# Patient Record
Sex: Female | Born: 1960 | Race: White | Hispanic: No | Marital: Married | Smoking: Never smoker
Health system: Southern US, Community
[De-identification: ages and names within clinical notes are randomized; demographics above are authoritative.]

## PROBLEM LIST (undated history)

## (undated) DIAGNOSIS — H04129 Dry eye syndrome of unspecified lacrimal gland: Secondary | ICD-10-CM

## (undated) HISTORY — DX: Dry eye syndrome of unspecified lacrimal gland: H04.129

---

## 1997-11-12 ENCOUNTER — Ambulatory Visit (HOSPITAL_COMMUNITY): Admission: RE | Admit: 1997-11-12 | Discharge: 1997-11-12 | Payer: Self-pay | Admitting: Obstetrics & Gynecology

## 1999-06-16 ENCOUNTER — Encounter: Payer: Self-pay | Admitting: Obstetrics and Gynecology

## 1999-06-16 ENCOUNTER — Ambulatory Visit (HOSPITAL_COMMUNITY): Admission: RE | Admit: 1999-06-16 | Discharge: 1999-06-16 | Payer: Self-pay | Admitting: Obstetrics and Gynecology

## 2002-04-02 ENCOUNTER — Ambulatory Visit (HOSPITAL_COMMUNITY): Admission: RE | Admit: 2002-04-02 | Discharge: 2002-04-02 | Payer: Self-pay | Admitting: Family Medicine

## 2003-04-22 ENCOUNTER — Encounter (INDEPENDENT_AMBULATORY_CARE_PROVIDER_SITE_OTHER): Payer: Self-pay | Admitting: Specialist

## 2003-04-22 ENCOUNTER — Ambulatory Visit (HOSPITAL_COMMUNITY): Admission: RE | Admit: 2003-04-22 | Discharge: 2003-04-22 | Payer: Self-pay | Admitting: Obstetrics and Gynecology

## 2006-09-06 ENCOUNTER — Ambulatory Visit (HOSPITAL_BASED_OUTPATIENT_CLINIC_OR_DEPARTMENT_OTHER): Admission: RE | Admit: 2006-09-06 | Discharge: 2006-09-06 | Payer: Self-pay | Admitting: Obstetrics and Gynecology

## 2006-09-06 ENCOUNTER — Encounter (INDEPENDENT_AMBULATORY_CARE_PROVIDER_SITE_OTHER): Payer: Self-pay | Admitting: Obstetrics and Gynecology

## 2007-03-20 ENCOUNTER — Encounter: Admission: RE | Admit: 2007-03-20 | Discharge: 2007-03-20 | Payer: Self-pay | Admitting: Obstetrics and Gynecology

## 2010-07-05 NOTE — Op Note (Signed)
NAME:  Stacey Mendez, Stacey Mendez                ACCOUNT NO.:  [id number]   MEDICAL RECORD NO.:  [id number]          PATIENT TYPE:  AMB   LOCATION:  [facility]                         FACILITY:  [facility]   PHYSICIAN:  Malachi Pro. Ambrose Mantle, M.D. DATE OF BIRTH:  1960/05/23   DATE OF PROCEDURE:  09/06/2006  DATE OF DISCHARGE:                               OPERATIVE REPORT   PREOPERATIVE DIAGNOSIS:  Abnormal uterine bleeding, fibroids.   POSTOPERATIVE DIAGNOSIS:  Abnormal uterine bleeding, fibroids, with  Nabothian cyst at 9 o'clock on the cervix.   OPERATION:  Dilatation and curettage, hysteroscopy, removal of Nabothian  cyst.   SURGEON:  Malachi Pro. Ambrose Mantle, M.D.   ANESTHESIA:  General anesthesia.   The patient was brought to the operating room and placed under  satisfactory general anesthesia and placed in lithotomy position.  Exam  revealed the uterus to be somewhat hard to feel, thought to be upper  limit of normal size.  The adnexa were free of masses.  The vulva,  vagina and perineum were prepped with Betadine solution and draped as a  sterile field with the collecting chamber underneath the buttocks to  collect the sorbitol.  The cervix was identified, drawn into the  operative field, and was dilated sufficiently to allow the hysteroscope  and sheath to be admitted into the endometrial cavity.  I took a survey  of the endometrial cavity.  I saw both tubal ostia and saw the fundus of  the uterus.  There was some tissue present in the endometrial cavity but  nothing suggestive of a polyp or fibroid. An endocervical curettage was  done followed by an endometrial curettage. The Nabothian cyst at 9  o'clock was removed.  I re-looked with the hysteroscope. I could tell  that most of the cavity had been curetted.  I used polyp forceps to try  to remove any additional tissue that had not been removed.  I then used  the hysteroscope biopsy forceps to remove one fragment of tissue.  At  the end of the procedure,  there was no bleeding.  Monsel's was then used  for the tenaculum sites and the cervical biopsy site and the procedure  was terminated.  The deficit on the sorbitol was 140 mL.  The patient  was returned to recovery in satisfactory condition.      Malachi Pro. Ambrose Mantle, M.D.  Electronically Signed     TFH/MEDQ  D:  09/06/2006  T:  09/06/2006  Job:  [id number]

## 2010-07-08 NOTE — Op Note (Signed)
NAME:  Stacey Mendez, Stacey Mendez                          ACCOUNT NO.:  [id number]   MEDICAL RECORD NO.:  [id number]                   PATIENT TYPE:  AMB   LOCATION:  DAY                                  FACILITY:  [facility]   PHYSICIAN:  Malachi Pro. Ambrose Mantle, M.D.              DATE OF BIRTH:  07-16-1960   DATE OF PROCEDURE:  04/22/2003  DATE OF DISCHARGE:                                 OPERATIVE REPORT   PREOPERATIVE DIAGNOSIS:  Abnormal uterine bleeding.   POSTOPERATIVE DIAGNOSIS:  Abnormal uterine bleeding with possible fibroid.   OPERATION:  Fraction dilatation and curettage, hysteroscopy.   SURGEON:  Malachi Pro. Ambrose Mantle, M.D.   ANESTHESIA:  General.   The patient was brought to the operating room and placed under satisfactory  general anesthesia and placed in the lithotomy position.  Exam revealed the  cervix to have a large nabothian cyst at 8 o'clock.  The uterus felt to be  enlarged and quite firm.  The adnexa were free of masses.  The uterine total  size was maybe 8-9 weeks.  The vulva, vagina, and perineum were prepped with  Betadine solution and draped as a sterile field.  A weighted speculum and  Deaver retractor were used for exposure.  The cervix was grasped with a  tenaculum and drawn into the operative field.  The uterus sounded to 4  inches slightly anteriorly.  The cervix was then dilated to a #29 Pratt  dilator.  An endocervical curettage was done, preserving all of the tissue  that I could find.  I then inserted the hysteroscope.  There was some  bleeding present, so I could not see extremely well, but I could see both  tubal ostia extremely well.  I could see the fundus of the uterus.  There  were no significant intraluminal masses.  I then removed the hysteroscope,  used polyp forceps, and then did a D&C until the tissue felt gritty.  I then  relooked with the hysteroscope, did one biopsy with the hysteroscopic  forceps, and felt that the cavity was pretty much clean.  Visibility  was  always somewhat diminished because of the vascularity of the endometrium.  The procedure was terminated.  There was a deficit of 70 cc.  The patient  was returned to recovery in satisfactory condition.                                               Malachi Pro. Ambrose Mantle, M.D.    TFH/MEDQ  D:  04/22/2003  T:  04/22/2003  Job:  [id number]

## 2010-12-05 LAB — CBC
HCT: 38.4
Hemoglobin: 13.2
MCHC: 34.3
MCV: 83.4
Platelets: 266
RBC: 4.6
RDW: 13.2
WBC: 4.9

## 2010-12-05 LAB — COMPREHENSIVE METABOLIC PANEL
ALT: 17
AST: 15
Albumin: 3.8
Alkaline Phosphatase: 58
BUN: 9
CO2: 25
Calcium: 9.4
Chloride: 109
Creatinine, Ser: 0.72
GFR calc Af Amer: >60
GFR calc non Af Amer: >60
Glucose, Bld: 136 — ABNORMAL HIGH
Potassium: 4.5
Sodium: 142
Total Bilirubin: 0.5
Total Protein: 6.4

## 2010-12-05 LAB — DIFFERENTIAL
Eosinophils Absolute: 0
Eosinophils Relative: 0
Lymphocytes Relative: 47 — ABNORMAL HIGH
Lymphs Abs: 2.3
Monocytes Absolute: 0.3
Monocytes Relative: 5

## 2010-12-05 LAB — PREGNANCY, URINE: Preg Test, Ur: NEGATIVE

## 2012-04-15 ENCOUNTER — Other Ambulatory Visit: Payer: Self-pay | Admitting: Family Medicine

## 2012-04-15 DIAGNOSIS — R109 Unspecified abdominal pain: Secondary | ICD-10-CM

## 2012-04-22 ENCOUNTER — Ambulatory Visit
Admission: RE | Admit: 2012-04-22 | Discharge: 2012-04-22 | Disposition: A | Discharge status: Home / Self Care | Payer: BC Managed Care – PPO | Source: Ambulatory Visit | Attending: Family Medicine | Admitting: Family Medicine

## 2012-04-22 DIAGNOSIS — R109 Unspecified abdominal pain: Secondary | ICD-10-CM

## 2012-06-03 ENCOUNTER — Other Ambulatory Visit: Payer: Self-pay | Admitting: Obstetrics and Gynecology

## 2013-01-10 ENCOUNTER — Other Ambulatory Visit: Payer: Self-pay | Admitting: Dermatology

## 2013-11-26 ENCOUNTER — Other Ambulatory Visit: Payer: Self-pay | Admitting: Obstetrics and Gynecology

## 2013-11-27 LAB — CYTOLOGY - PAP

## 2015-07-29 ENCOUNTER — Ambulatory Visit
Admission: RE | Admit: 2015-07-29 | Discharge: 2015-07-29 | Disposition: A | Discharge status: Home / Self Care | Payer: BLUE CROSS/BLUE SHIELD | Source: Ambulatory Visit | Attending: Family Medicine | Admitting: Family Medicine

## 2015-07-29 ENCOUNTER — Other Ambulatory Visit: Payer: Self-pay | Admitting: Family Medicine

## 2015-07-29 DIAGNOSIS — R0781 Pleurodynia: Secondary | ICD-10-CM

## 2015-12-13 ENCOUNTER — Ambulatory Visit (INDEPENDENT_AMBULATORY_CARE_PROVIDER_SITE_OTHER): Payer: BLUE CROSS/BLUE SHIELD | Admitting: Neurology

## 2015-12-13 ENCOUNTER — Encounter: Payer: Self-pay | Admitting: Neurology

## 2015-12-13 VITALS — BP 160/98 | HR 78 | Resp 16 | Ht 62.0 in | Wt 176.0 lb

## 2015-12-13 DIAGNOSIS — R51 Headache: Secondary | ICD-10-CM | POA: Diagnosis not present

## 2015-12-13 DIAGNOSIS — R251 Tremor, unspecified: Secondary | ICD-10-CM

## 2015-12-13 DIAGNOSIS — F40240 Claustrophobia: Secondary | ICD-10-CM | POA: Diagnosis not present

## 2015-12-13 DIAGNOSIS — R519 Headache, unspecified: Secondary | ICD-10-CM

## 2015-12-13 MED ORDER — PROPRANOLOL HCL 20 MG PO TABS: ORAL_TABLET | ORAL | 5 refills | Status: DC

## 2015-12-13 MED ORDER — ALPRAZOLAM 0.5 MG PO TABS: ORAL_TABLET | ORAL | 0 refills | Status: DC

## 2015-12-13 NOTE — Patient Instructions (Addendum)
Your tremor is mild, we will continue to monitor and try a small dose of symptomatic medication.  You have no signs for parkinsonism.   We will do a brain scan, called MRI and call you with the test results. We will have to schedule you for this on a separate date. This test requires authorization from your insurance, and we will take care of the insurance process.  We will try low dose Inderal (generic name: propranolol) 20 mg strength: take 1 pill each bedtime for 1 week, then 1 pill twice daily for 2 weeks, then 1 pill 3 times a day thereafter. Common side effect reported are: lethargy, sedation, low blood pressure and low pulse rate. Please monitor your BP and Pulse every few days, if your pulse drops lower than 55 you may feel bad and we may have to adjust your dose. Same with your BP below 110/55.   You can also use this eventually as needed. It may help your tremor, your migrainous headaches and tone down your anxiety.

## 2015-12-13 NOTE — Progress Notes (Signed)
Subjective:    Patient ID: Stacey Mendez is a 55 y.o. female.  HPI     Star Age, MD, PhD [facility] [address], [address] [place], [place] [address]  Dear Dr. Rex Kras,   I saw your patient, Stacey Mendez, upon your kind request in my neurologic clinic today for initial consultation of her tremors. The patient is accompanied by her husband today. As you know, Stacey Mendez is a 55 year old left-handed woman with an underlying medical history of hearing loss, chronic dry eyes, and obesity, and elevated blood pressure values, who reports bilateral upper extremity tremors for years. She has a family history of tremors in her brother and one sister. She was recently treated for sinusitis. She was recently also evaluated by her ophthalmologist for problems with her eyes and was diagnosed with dry eyes. I reviewed your office note from 11/19/2015, which you kindly included. Recent lab work includes CBC with differential, TSH, CMP, ESR, lipid panel. We will request test results from your office. She was treated with a Z-Pak in late September 2017 for sinusitis.  She also has a history of recurrent migrainous headaches for the past 2 months. She has been seen by ENT for suspected sinus issues. She had a CT which reportedly was normal. 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 I reviewed the office note from 11/30/2015, from Dr. Caprice Kluver as well. She has a Hx of migraines, infrequent, 1-2 per year.  HAs or eye pain is around the eyes, L more than R. Has had more anxiety. No recent stressors. She lives with her husband, this is her second marriage. She has 3 grown children from her first marriage. She does not drink caffeine in excess, usually one large sweet tea from [employer] per day. She is a nonsmoker and does not drink alcohol on a regular basis. She has 1 brother and 1 sister with tremors, one aunt with Parkinson's disease. She is left-handed and her handwriting  is not particularly affected by her fine motor skills are affected by the tremor. Nervousness and anxiety makes the tremor worse. She has had hot flashes and is perimenopausal. She has no overt triggers for her headaches or eye pain. She does endorse being anxious. Her husband vehemently endorses that she has been anxious. She has had recent borderline blood pressure values and was asked to keep a log. This also caused her anxiety to get worse she admits.  Her Past Medical History Is Significant For: Past Medical History:  Diagnosis Date  . Dry eye     Her Past Surgical History Is Significant For: Past Surgical History:  Procedure Laterality Date  . CESAREAN SECTION     x3    Her Family History Is Significant For: Family History  Problem Relation Age of Onset  . Fibromyalgia Mother   . Thyroid cancer Mother   . Hypertension Father   . Fibromyalgia Sister   . Breast cancer Maternal Grandmother     Her Social History Is Significant For: Social History   Social History  . Marital status: Married    Spouse name: N/A  . Number of children: 3  . Years of education: Assoc   Occupational History  . Retired    Social History Main Topics  . Smoking status: Never Smoker  . Smokeless tobacco: Never Used  . Alcohol use No  . Drug use: No  . Sexual activity: Not Asked   Other Topics Concern  . None  Social History Narrative   Drinks 1 large tea a day     Her Allergies Are:  Allergies  Allergen Reactions  . Cefaclor Diarrhea and Other (See Comments)  . Codeine Other (See Comments)    Numb all over  :   Her Current Medications Are:  Outpatient Encounter Prescriptions as of 12/13/2015  Medication Sig  . Polyethyl Glycol-Propyl Glycol (SYSTANE OP) Apply to eye.  . prednisoLONE acetate (PRED FORTE) 1 % ophthalmic suspension   . RESTASIS MULTIDOSE 0.05 % ophthalmic emulsion    No facility-administered encounter medications on file as of 12/13/2015.   : Review of  Systems:  Out of a complete 14 point review of systems, all are reviewed and negative with the exception of these symptoms as listed below:  Review of Systems  Neurological:       Patient reports having tremors in R hand for years. Recently it has become worse and started on the L side. She states that she is currently having a tremor in her R knee.   Patient has had pain around L eye for about 2 months. Has been seen by eye doctor. Dr. Dimas Millin ordered CT scan, reported no trouble with sinuses.    Objective:  Neurologic Exam  Physical Exam Physical Examination:   Vitals:   12/13/15 1429  BP: (!) 160/98  Pulse: 78  Resp: 16   General Examination: The patient is a very pleasant 55 y.o. female in no acute distress. She appears well-developed and well-nourished and well groomed. She is mildly anxious appearing.  HEENT: Normocephalic, atraumatic, pupils are equal, round and reactive to light and accommodation. Visual fields are full by finger perimetry.  Funduscopic exam is normal with sharp disc margins noted. Extraocular tracking is good without limitation to gaze excursion or nystagmus noted. Normal smooth pursuit is noted. Hearing is grossly intact. Tympanic membranes are clear bilaterally. Face is symmetric with normal facial animation and normal facial sensation. Speech is clear with no dysarthria noted. There is no hypophonia. There is no lip, neck/head, jaw or voice tremor. Neck is supple with full range of passive and active motion. There are no carotid bruits on auscultation. Oropharynx exam reveals: mild mouth dryness, good dental hygiene and mild airway crowding, due to smaller airway and larger appearing uvula. Mallampati is class II. Tongue protrudes centrally and palate elevates symmetrically. Tonsils are small in size.   Chest: Clear to auscultation without wheezing, rhonchi or crackles noted.  Heart: S1+S2+0, regular and normal without murmurs, rubs or gallops noted.    Abdomen: Soft, non-tender and non-distended with normal bowel sounds appreciated on auscultation.  Extremities: There is no pitting edema in the distal lower extremities bilaterally. Pedal pulses are intact. Legs are puffy.   Skin: Warm and dry without trophic changes noted.   Musculoskeletal: exam reveals no obvious joint deformities, tenderness or joint swelling or erythema.   Neurologically:  Mental status: The patient is awake, alert and oriented in all 4 spheres. Her immediate and remote memory, attention, language skills and fund of knowledge are appropriate. There is no evidence of aphasia, agnosia, apraxia or anomia. Speech is clear with normal prosody and enunciation. Thought process is linear. Mood is normal and affect is normal.  Cranial nerves II - XII are as described above under HEENT exam. In addition: shoulder shrug is normal with equal shoulder height noted. Motor exam: Normal bulk, strength and tone is noted. There is no drift, rest tremor or rebound.  On 12/13/2015: Her handwriting is  legible and not very tremulous, not micrographic. On Archimedes spiral drawing she has slight tremor on the left and slight to mild tremor noted on the right. Romberg is negative. Reflexes are 2-3+ throughout. Babinski: Toes are flexor bilaterally. Fine motor skills and coordination: intact with normal finger taps, normal hand movements, normal rapid alternating patting, normal foot taps and normal foot agility.  Cerebellar testing: No dysmetria or intention tremor on finger to nose testing. Heel to shin is unremarkable bilaterally. There is no truncal or gait ataxia.  Sensory exam: intact to light touch, pinprick, vibration, temperature sense in the upper and lower extremities.  Gait, station and balance: She stands easily. No veering to one side is noted. No leaning to one side is noted. Posture is age-appropriate and stance is narrow based. Gait shows normal stride length and normal pace. No  problems turning are noted. Tandem walk is unremarkable.               Assessment and Plan:   In summary, Stacey Mendez is a very pleasant 55 y.o.-year old female with an underlying medical history of hearing loss, chronic dry eyes, and obesity, and elevated blood pressure values, whose history and physical exam are in keeping with essential tremor. She has a history of tremors for years including a family history in 2 siblings. While she has a family history of Parkinson's disease and one aunt, I reassured her that her exam does not show any telltale signs of parkinsonism. For her recurrent headaches, she is advised that these could be migrainous headaches, in particular what with her own history of migraines and recent benign exam with ophthalmology and ENT. Exam is otherwise nonfocal and she is reassured. Today, I suggested for headache prevention as well as tremor symptomatic treatment we could try her on low-dose propranolol, starting at 20 mg strength once daily with gradual titration to up to 20 mg 3 times a day. I provided written instructions and a new prescription. In addition, this may help tone down her anxiety as well. She is agreeable to trying this, I also went over potential side effects with her as well. Diagnostically, I would like to proceed with a brain MRI with and without contrast and we will call her with her test results.  I suggested a follow-up in 4 months, sooner as needed.  She requested an open MRI and I also prescribed Xanax for her MRI due to claustrophobia reported.  reports bilateral upper extremity tremors for years. with an underlying medical I answered all their questions today and the patient and her husband were in agreement with the above outlined plan.  Thank you very much for allowing me to participate in the care of this nice patient. If I can be of any further assistance to you please do not hesitate to call me at [phone].  Sincerely,   Star Age, MD,  PhD

## 2015-12-14 ENCOUNTER — Telehealth: Payer: Self-pay | Admitting: Neurology

## 2015-12-14 NOTE — Telephone Encounter (Signed)
Patient is calling to discuss the dosage for medication propranolol (INDERAL) 20 MG tablet. Please call and advise.

## 2015-12-14 NOTE — Telephone Encounter (Signed)
I spoke to patient and advised her the verbal orders that Dr. Frances FurbishAthar just gave me. Dr. Frances FurbishAthar wants her to take as prescribed for now to see if patient feels a difference. Patient voiced understanding and will call us back if needed.

## 2016-01-03 ENCOUNTER — Telehealth: Payer: Self-pay | Admitting: Neurology

## 2016-01-03 NOTE — Telephone Encounter (Signed)
Pt called said she pulled a muscle this weekend. She wants to take advil or ibuprofen (she has RX for 800mg  but she knows that is too much). She is taking propranolol (INDERAL) 20 MG tablet and she read that NSAIDS are to be taken with caution. Please call and let her know how much or what to take with the propranolol. Thank you

## 2016-01-03 NOTE — Telephone Encounter (Signed)
Pt called to schedule MRI.  °

## 2016-01-03 NOTE — Telephone Encounter (Signed)
I have spoken with Stacey Mendez this afternoon and per RAS, advised it is ok to take Ibuprofen 800mg  as rx'd for short term--generally no longer than a week.  She verbalized understanding of same/fim

## 2016-01-03 NOTE — Telephone Encounter (Signed)
I called the patient back and spoke with her since she wanted a open MRI I sent the order to [facility]. She is aware of this and I also gave her their phone number which is [phone] [employer] NumbersBCBS auth: [id number] (exp. 01/03/16 to 02/01/16)

## 2016-01-05 DIAGNOSIS — R51 Headache: Secondary | ICD-10-CM | POA: Diagnosis not present

## 2016-01-06 ENCOUNTER — Ambulatory Visit (INDEPENDENT_AMBULATORY_CARE_PROVIDER_SITE_OTHER): Payer: Self-pay

## 2016-01-06 DIAGNOSIS — Z0289 Encounter for other administrative examinations: Secondary | ICD-10-CM

## 2016-01-06 DIAGNOSIS — R251 Tremor, unspecified: Secondary | ICD-10-CM

## 2016-01-06 DIAGNOSIS — R519 Headache, unspecified: Secondary | ICD-10-CM

## 2016-01-06 DIAGNOSIS — R51 Headache: Principal | ICD-10-CM

## 2016-01-18 NOTE — Progress Notes (Signed)
Please call patient regarding the recent brain MRI: The brain scan showed a normal structure of the brain and no volume loss which we call atrophy. There were changes in the deeper structures of the brain, which we call white matter changes or microvascular changes. These were reported as mild in Her case. These are tiny white spots, that occur with time and are seen in a variety of conditions, including with normal aging, chronic hypertension, chronic headaches, especially migraine HAs, chronic diabetes, chronic hyperlipidemia. These are not strokes and no mass or lesion or contrast enhancement was seen which is reassuring. There are possible findings suggesting possible increase fluid pressure around the eye nerves/optic nerves. Please have her see her eye doctor for a full eye exam including eye background check and full visual field check and to see if there is any evidence of papilledema, meaning swelling of the eye nerves.  Again, there were no acute findings, such as a stroke, or mass or blood products. No other action is required on this test at this time, other than re-enforcing the importance of good blood pressure control, good cholesterol control, good blood sugar control, and weight management. Please remind patient to keep any upcoming appointments or tests and to call us with any interim questions, concerns, problems or updates. Thanks,  Huston FoleySaima Anisa Leanos, MD, PhD

## 2016-01-20 ENCOUNTER — Telehealth: Payer: Self-pay

## 2016-01-20 MED ORDER — PROPRANOLOL HCL ER 60 MG PO CP24: 60.0000 mg | ORAL_CAPSULE | Freq: Every day | ORAL | 5 refills | Status: DC

## 2016-01-20 NOTE — Telephone Encounter (Signed)
-----   Message from Huston FoleySaima Athar, MD sent at 01/18/2016  4:03 PM EST ----- Please call patient regarding the recent brain MRI: The brain scan showed a normal structure of the brain and no volume loss which we call atrophy. There were changes in the deeper structures of the brain, which we call white matter changes or microvascular changes. These were reported as mild in Her case. These are tiny white spots, that occur with time and are seen in a variety of conditions, including with normal aging, chronic hypertension, chronic headaches, especially migraine HAs, chronic diabetes, chronic hyperlipidemia. These are not strokes and no mass or lesion or contrast enhancement was seen which is reassuring. There are possible findings suggesting possible increase fluid pressure around the eye nerves/optic nerves. Please have her see her eye doctor for a full eye exam including eye background check and full visual field check and to see if there is any evidence of papilledema, meaning swelling of the eye nerves.  Again, there were no acute findings, such as a stroke, or mass or blood products. No other action is required on this test at this time, other than re-enforcing the importance of good blood pressure control, good cholesterol control, good blood sugar control, and weight management. Please remind patient to keep any upcoming appointments or tests and to call us with any interim questions, concerns, problems or updates. Thanks,  Huston FoleySaima Athar, MD, PhD

## 2016-01-20 NOTE — Telephone Encounter (Signed)
I would like to suggest changing her propranolol 20 mg tid to long acting 60 mg once daily. She can take it in the morning after breakfast. Rx changed and sent to her pharm. Please call and notify patient, thx.

## 2016-01-20 NOTE — Telephone Encounter (Signed)
I spoke to patient and she is aware of medication change. She will start tomorrow. Confirmed pharmacy.

## 2016-01-20 NOTE — Telephone Encounter (Signed)
I spoke to patient and she is aware of results and recommendations. She will make an appointment with an eye doctor.  Patient wants to ask if you can change her medication. Aurea GraffJoan states that her tremors are better, her anxiety is reduced, and she feels better on the Propranolol, but is having a hard time taking it 3 times a day. She asks if there is something she can take only once a day?

## 2016-01-21 ENCOUNTER — Telehealth: Payer: Self-pay | Admitting: Neurology

## 2016-01-21 NOTE — Telephone Encounter (Signed)
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 If there is increase in HAs, worse eye exam or new/worse Sx, we may proceed with LP to look for increased fluid pressure and also consider neuroophth referral [facility].  Please call patient to update her as to above plan and that Dr. Jimmey RalphParker and I spoke and agreed.

## 2016-01-24 NOTE — Telephone Encounter (Signed)
Patient is aware of information and recommendations below. Patient states that Dr. Jimmey RalphParker called her this past Friday and gave the same information. Patient has eye exam in January, she states that Dr. Jimmey RalphParker says that it is ok to wait until then.

## 2016-02-04 ENCOUNTER — Encounter: Payer: Self-pay | Admitting: Neurology

## 2016-04-17 ENCOUNTER — Encounter: Payer: Self-pay | Admitting: Neurology

## 2016-04-17 ENCOUNTER — Ambulatory Visit (INDEPENDENT_AMBULATORY_CARE_PROVIDER_SITE_OTHER): Payer: BLUE CROSS/BLUE SHIELD | Admitting: Neurology

## 2016-04-17 VITALS — BP 123/75 | HR 65 | Resp 20 | Ht 62.0 in | Wt 181.0 lb

## 2016-04-17 DIAGNOSIS — R0683 Snoring: Secondary | ICD-10-CM

## 2016-04-17 DIAGNOSIS — G250 Essential tremor: Secondary | ICD-10-CM

## 2016-04-17 DIAGNOSIS — R51 Headache: Secondary | ICD-10-CM | POA: Diagnosis not present

## 2016-04-17 DIAGNOSIS — R519 Headache, unspecified: Secondary | ICD-10-CM

## 2016-04-17 MED ORDER — PROPRANOLOL HCL ER 60 MG PO CP24: 60.0000 mg | ORAL_CAPSULE | Freq: Every day | ORAL | 3 refills | Status: DC

## 2016-04-17 NOTE — Progress Notes (Signed)
Subjective:    Stacey Mendez ID: Stacey Stacey Mendez is a 56 y.o. female.  HPI     Interim history:   Stacey Stacey Mendez is a 56 year old left-handed woman with an underlying medical history of hearing loss, chronic dry eyes, and obesity, and elevated blood pressure values, who presents for follow-up consultation of Stacey Stacey Mendez tremors. Stacey Stacey Mendez is accompanied by Stacey Stacey Mendez today. I first met Stacey Stacey Mendez on 12/13/2015 at Stacey request of Stacey Stacey Mendez primary care physician, at which time Stacey Stacey Mendez reported a long-standing history of tremors. Stacey Stacey Mendez clinical exam was in keeping with essential tremor. I suggested symptomatic treatment with low-dose beta blocker in Stacey form of propranolol with gradual titration. I also ordered a brain MRI with and without contrast, Stacey Stacey Mendez had this on 01/06/2016 and I reviewed Stacey results:    IMPRESSION:  This MRI of Stacey brain with and without contrast shows Stacey following: 1.    There are a few scattered subcortical T2/FLAIR hyperintense foci in Stacey frontal lobes consistent with minimal chronic microvascular ischemic changes or changes related to migraine. None of these appear to be acute. 2.     There is a "partially empty sella turcica" in Stacey optic nerve sheaths are mildly widened. Although this could be an incidental finding, this could also be due to elevated intracranial pressure. 3.     There are no acute findings.   We called Stacey Stacey Mendez with Stacey Stacey Mendez test results at Stacey time. Stacey Stacey Mendez had met with Stacey Stacey Mendez ophthalmologist in Stacey interim and had a benign exam without any visual field deficits. I suggested in Stacey interim that we change Stacey Stacey Mendez propranolol to once daily long-acting after Stacey Stacey Mendez was able to titrate Stacey immediate release by Stacey end of November 2017.  I had also talked to Stacey Stacey Mendez ophthalmologist, Dr. Jerline Pain in Stacey interim on Stacey phone about Stacey MRI results.  Today, 04/17/2016: Stacey Stacey Mendez reports doing better with Stacey tremor on Stacey beta blocker and BP better. Stacey Stacey Mendez snores, but it is intermittent. Some nasal congestion.Has had some worsening  of Stacey Stacey Mendez ringing in Stacey Stacey Mendez ears. Stacey Stacey Mendez felt that Stacey Stacey Mendez hearing was worse. Stacey Stacey Mendez had it checked out at Stacey Stacey Mendez hearing is Stacey same, Stacey Stacey Mendez does have hearing aids which Stacey Stacey Mendez did not bring today. I reviewed Stacey Stacey Mendez most recent eye exam at [facility] from 02/09/2016. Stacey Stacey Mendez eye exam was benign bilaterally. We talked about Stacey Stacey Mendez MRI results. Stacey Stacey Mendez reports intermittent headaches which are less severe. Stacey Stacey Mendez agrees that Stacey Stacey Mendez tremors are better. Stacey Stacey Mendez reports that Stacey Stacey Mendez snores but not every night, mostly when Stacey Stacey Mendez sleeps on Stacey Stacey Mendez back. Stacey Stacey Mendez does not typically have morning headaches.  Stacey Stacey Mendez's allergies, current medications, family history, past medical history, past social history, past surgical history and problem list were reviewed and updated as appropriate.   Previously (copied from previous notes for reference):   12/13/2015: Stacey Stacey Mendez reports bilateral upper extremity tremors for years. Stacey Stacey Mendez has a family history of tremors in Stacey Stacey Mendez brother and one sister. Stacey Stacey Mendez was recently treated for sinusitis. Stacey Stacey Mendez was recently also evaluated by Stacey Stacey Mendez ophthalmologist for problems with Stacey Stacey Mendez eyes and was diagnosed with dry eyes. I reviewed your office note from 11/19/2015, which you kindly included. Recent lab work includes CBC with differential, TSH, CMP, ESR, lipid panel. We will request test results from your office. Stacey Stacey Mendez was treated with a Z-Pak in late September 2017 for sinusitis.  Stacey Stacey Mendez also has a history of recurrent migrainous headaches for Stacey past 2 months. Stacey Stacey Mendez has been seen by ENT for suspected sinus issues. Stacey Stacey Mendez had a CT which  reportedly was normal. 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 I reviewed Stacey office note from 11/30/2015, from Dr. Caprice Kluver as well. Stacey Stacey Mendez has a Hx of migraines, infrequent, 1-2 per year.  HAs or eye pain is around Stacey eyes, L more than R. Has had more anxiety. No recent stressors. Stacey Stacey Mendez lives with Stacey Stacey Mendez, this is Stacey Stacey Mendez second marriage. Stacey Stacey Mendez has 3 grown children from Stacey Stacey Mendez first marriage. Stacey Stacey Mendez does not drink  caffeine in excess, usually one large sweet tea from [employer] per day. Stacey Stacey Mendez is a nonsmoker and does not drink alcohol on a regular basis. Stacey Stacey Mendez has 1 brother and 1 sister with tremors, one aunt with Parkinson's disease. Stacey Stacey Mendez is left-handed and Stacey Stacey Mendez handwriting is not particularly affected by Stacey Stacey Mendez fine motor skills are affected by Stacey tremor. Nervousness and anxiety makes Stacey tremor worse. Stacey Stacey Mendez has had hot flashes and is perimenopausal. Stacey Stacey Mendez has no overt triggers for Stacey Stacey Mendez headaches or eye pain. Stacey Stacey Mendez does endorse being anxious. Stacey Stacey Mendez vehemently endorses that Stacey Stacey Mendez has been anxious. Stacey Stacey Mendez has had recent borderline blood pressure values and was asked to keep a log. This also caused Stacey Stacey Mendez anxiety to get worse Stacey Stacey Mendez admits.  Stacey Stacey Mendez Past Medical History Is Significant For: Past Medical History:  Diagnosis Date  . Dry eye     Stacey Stacey Mendez Past Surgical History Is Significant For: Past Surgical History:  Procedure Laterality Date  . CESAREAN SECTION     x3    Stacey Stacey Mendez Family History Is Significant For: Family History  Problem Relation Age of Onset  . Fibromyalgia Mother   . Thyroid cancer Mother   . Hypertension Father   . Fibromyalgia Sister   . Breast cancer Maternal Grandmother     Stacey Stacey Mendez Social History Is Significant For: Social History   Social History  . Marital status: Married    Spouse name: N/A  . Number of children: 3  . Years of education: Assoc   Occupational History  . Retired    Social History Main Topics  . Smoking status: Never Smoker  . Smokeless tobacco: Never Used  . Alcohol use No  . Drug use: No  . Sexual activity: Not Asked   Other Topics Concern  . None   Social History Narrative   Drinks 1 large tea a day     Stacey Stacey Mendez Allergies Are:  Allergies  Allergen Reactions  . Cefaclor Diarrhea and Other (See Comments)  . Codeine Other (See Comments)    Numb all over  :   Stacey Stacey Mendez Current Medications Are:  Outpatient Encounter Prescriptions as of 04/17/2016  Medication Sig  . estradiol  (VIVELLE-DOT) 0.05 MG/24HR patch   . progesterone (PROMETRIUM) 100 MG capsule Take 100 mg by mouth daily.  . propranolol ER (INDERAL LA) 60 MG 24 hr capsule Take 1 capsule (60 mg total) by mouth daily.  . [DISCONTINUED] ALPRAZolam (XANAX) 0.5 MG tablet Take 1-2 pills as needed on call to MRI.  . [DISCONTINUED] Polyethyl Glycol-Propyl Glycol (SYSTANE OP) Apply to eye.  . [DISCONTINUED] prednisoLONE acetate (PRED FORTE) 1 % ophthalmic suspension   . [DISCONTINUED] RESTASIS MULTIDOSE 0.05 % ophthalmic emulsion    No facility-administered encounter medications on file as of 04/17/2016.   :  Review of Systems:  Out of a complete 14 point review of systems, all are reviewed and negative with Stacey exception of these symptoms as listed below: Review of Systems  Neurological:       Pt presents today to discuss Stacey Stacey Mendez headaches. Stacey Stacey Mendez says that Stacey Stacey Mendez is still  having headaches but they are not always Stacey same kind.    Objective:  Neurologic Exam  Physical Exam Physical Examination:   Vitals:   04/17/16 1313  BP: 123/75  Pulse: 65  Resp: 20    General Examination: Stacey Stacey Mendez is a very pleasant 56 y.o. female in no acute distress. Stacey Stacey Mendez appears well-developed and well-nourished and well groomed.   HEENT: Normocephalic, atraumatic, pupils are equal, round and reactive to light and accommodation. Extraocular tracking is good without limitation to gaze excursion or nystagmus noted. Normal smooth pursuit is noted. Hearing is mildly impaired and Stacey Stacey Mendez forgot Stacey Stacey Mendez hearing aids. Face is symmetric with normal facial animation and normal facial sensation. Speech is clear with no dysarthria noted. There is no hypophonia. There is no lip, neck/head, jaw or voice tremor. Neck is supple with full range of passive and active motion. There are no carotid bruits on auscultation. Oropharynx exam reveals: mild mouth dryness, good dental hygiene and mild airway crowding, due to smaller airway entry and larger uvula. Tonsils are  small. Neck circumference is 14-3/4 inches.  Chest: Clear to auscultation without wheezing, rhonchi or crackles noted.  Heart: S1+S2+0, regular and normal without murmurs, rubs or gallops noted.   Abdomen: Soft, non-tender and non-distended with normal bowel sounds appreciated on auscultation.  Extremities: There is no pitting edema in Stacey distal lower extremities bilaterally. Pedal pulses are intact.  Skin: Warm and dry without trophic changes noted.  Musculoskeletal: exam reveals no obvious joint deformities, tenderness or joint swelling or erythema.   Neurologically:  Mental status: Stacey Stacey Mendez is awake, alert and oriented in all 4 spheres. Stacey Stacey Mendez immediate and remote memory, attention, language skills and fund of knowledge are appropriate. There is no evidence of aphasia, agnosia, apraxia or anomia. Speech is clear with normal prosody and enunciation. Thought process is linear. Mood is normal and affect is normal.  Cranial nerves II - XII are as described above under HEENT exam. In addition: shoulder shrug is normal with equal shoulder height noted. Motor exam: Normal bulk, strength and tone is noted. There is no drift, resting tremor or rebound. Romberg is negative. Stacey Stacey Mendez has no significant postural tremor, slight action tremor bilaterally. Fine motor skills with finger taps and hand movements as well as foot agility is normal. Cerebellar testing shows no dysmetria or intention tremor.  Sensory exam: intact to light touch in Stacey upper and lower extremities.  Gait, station and balance: Stacey Stacey Mendez stands easily. No veering to one side is noted. No leaning to one side is noted. Posture is age-appropriate and stance is narrow based. Gait shows normal stride length and normal pace. No problems turning are noted. Tandem walk is slightly difficult today.   Assessment and Plan:   In summary, DAWNN NAM is a very pleasant 56 y.o.-year old female with an underlying medical history of hearing loss, dry  eyes, elevated blood pressure values and obesity who presents for follow-up consultation of Stacey Stacey Mendez essential tremor. Stacey Stacey Mendez also reports recurrent headaches. Stacey Stacey Mendez is advised that Stacey Stacey Mendez recent MRI showed fairly benign findings, Stacey Stacey Mendez had follow-up eye testing to look for any signs of visual field loss but Stacey Stacey Mendez had a benign exam. We will continue to monitor. Stacey Stacey Mendez has had an improvement in Stacey Stacey Mendez tremors with symptomatic treatment with propranolol. We started with immediate release propranolol with gradual titration, Stacey Stacey Mendez has no transition to long-acting propranolol 60 movements once daily, I suggested we continue with this. I renewed Stacey Stacey Mendez prescription provided a 90 day prescription with refills. I  suggested Stacey Stacey Mendez consider coming back for a sleep study or considering a home sleep test because Stacey Stacey Mendez has a history of snoring and recurrent headaches, As well as obesity. Stacey Stacey Mendez does have a mildly crowded appearing airway as well. Stacey Stacey Mendez will think about it. I suggested a six-month checkup with one of our nurse practitioners and I will see Stacey Stacey Mendez back after that. I answered all Stacey Stacey Mendez questions today and Stacey Stacey Mendez and Stacey Stacey Mendez were in agreement. I spent 20 minutes in total face-to-face time with Stacey Stacey Mendez, more than 50% of which was spent in counseling and coordination of care, reviewing test results, reviewing medication and discussing or reviewing Stacey diagnosis of ET, its prognosis and treatment options. Pertinent laboratory and imaging test results that were available during this visit with Stacey Stacey Mendez were reviewed by me and considered in my medical decision making (see chart for details).

## 2016-04-17 NOTE — Patient Instructions (Signed)
We will continue with the long acting Propranolol.  We will consider a home sleep test.  Follow up in 6 months .

## 2016-07-12 ENCOUNTER — Telehealth: Payer: Self-pay | Admitting: Neurology

## 2016-07-12 DIAGNOSIS — G250 Essential tremor: Secondary | ICD-10-CM

## 2016-07-12 MED ORDER — PROPRANOLOL HCL 20 MG PO TABS: 20.0000 mg | ORAL_TABLET | Freq: Two times a day (BID) | ORAL | 5 refills | Status: DC

## 2016-07-12 NOTE — Telephone Encounter (Signed)
I spoke to patient and she is aware of information below. She usually takes her Propranolol ER at night, she will not take it tonight and start her new medication.

## 2016-07-12 NOTE — Telephone Encounter (Signed)
Patient called office in reference to propranolol ER (INDERAL LA) 60 MG 24 hr capsule.  Patient stated she started working out/walking leaving her feeling weak, drained.  BP reading today was 105/75 patient would like to know if she is to continue BP medication.  Please call.

## 2016-07-12 NOTE — Telephone Encounter (Signed)
I suggest we reduce the propranolol to 20 mg pills immediate release, take 1 pill twice daily. This is a total dose of 40 mg as opposed to 60 mg. Rx changed and sent to pharmacy on file. Please advise patient.

## 2016-07-12 NOTE — Addendum Note (Signed)
Addended by: Huston FoleyATHAR, Turrell Severt on: 07/12/2016 11:32 AM   Modules accepted: Orders

## 2016-10-16 ENCOUNTER — Encounter: Payer: Self-pay | Admitting: Adult Health

## 2016-10-16 ENCOUNTER — Ambulatory Visit (INDEPENDENT_AMBULATORY_CARE_PROVIDER_SITE_OTHER): Payer: BLUE CROSS/BLUE SHIELD | Admitting: Adult Health

## 2016-10-16 VITALS — BP 110/70 | HR 82 | Ht 62.0 in | Wt 179.8 lb

## 2016-10-16 DIAGNOSIS — R51 Headache: Secondary | ICD-10-CM | POA: Diagnosis not present

## 2016-10-16 DIAGNOSIS — G250 Essential tremor: Secondary | ICD-10-CM | POA: Diagnosis not present

## 2016-10-16 DIAGNOSIS — R519 Headache, unspecified: Secondary | ICD-10-CM

## 2016-10-16 NOTE — Patient Instructions (Signed)
Your Plan:  Continue Propranolol  Consider sleep study If your symptoms worsen or you develop new symptoms please let us know.   Thank you for coming to see [place] at [facility]. I hope we have been able to provide you high quality care today.  You may receive a patient satisfaction survey over the next few weeks. We would appreciate your feedback and comments so that we may continue to improve ourselves and the health of our patients.

## 2016-10-16 NOTE — Progress Notes (Addendum)
PATIENT: Stacey Mendez DOB: 01-10-1961  REASON FOR VISIT: follow up HISTORY FROM: patient  HISTORY OF PRESENT ILLNESS: Today 10/16/16 Stacey Mendez is a 56 year old female with a history of essential tremor and headaches. She returns today for follow-up. She reports with propranolol 20 mg twice a day her tremor has improved. She states that she gets nervous or tremor is worse. She states before she started propranolol she could not hold a plate of food without shaking. She is able to complete all ADLs independently. She does operate a motor vehicle without difficulty. She reports that her headaches have not improved. She states that she has at least 2 headaches a week. She is unsure if the headaches are related to menopause or possibly sinus headaches. She did follow with ENT and they did not feel that this was sinus related. She states that the headaches typically occur above the eyebrow or under the eye. She does have some headaches that wake her out of sleep and others occur at different times during the day. She states that she can typically take Tylenol with good benefit. Occasionally she will also use Zyrtec. She does report snoring. She does take a nap most afternoons. She returns today for an evaluation.  HISTORY Stacey Mendez is a 25 year oldleft-handed woman with an underlying medical history of hearing loss, chronic dry eyes, and obesity, and elevated blood pressure values, who presents for follow-up consultation of her tremors. The patient is accompanied by her husband today. I first met her on 12/13/2015 at the request of her primary care physician, at which time she reported a long-standing history of tremors. Her clinical exam was in keeping with essential tremor. I suggested symptomatic treatment with low-dose beta blocker in the form of propranolol with gradual titration. I also ordered a brain MRI with and without contrast, she had this on 01/06/2016 and I reviewed the results:    IMPRESSION: This MRI of the brain with and without contrast shows the following: 1. There are a few scattered subcortical T2/FLAIR hyperintense foci in the frontal lobes consistent with minimal chronic microvascular ischemic changes or changes related to migraine. None of these appear to be acute. 2. There is a "partially empty sella turcica" in the optic nerve sheaths are mildly widened. Although this could be an incidental finding, this could also be due to elevated intracranial pressure. 3. There are no acute findings.  We called her with her test results at the time. She had met with her ophthalmologist in the interim and had a benign exam without any visual field deficits. I suggested in the interim that we change her propranolol to once daily long-acting after she was able to titrate the immediate release by the end of November 2017.  I had also talked to her ophthalmologist, Dr. Jerline Pain in the interim on the phone about the MRI results.   04/17/2016: She reports doing better with the tremor on the beta blocker and BP better. She snores, but it is intermittent. Some nasal congestion.Has had some worsening of her ringing in her ears. She felt that her hearing was worse. She had it checked out at her hearing is the same, she does have hearing aids which she did not bring today. I reviewed her most recent eye exam at [facility] from 02/09/2016. Her eye exam was benign bilaterally. We talked about her MRI results. She reports intermittent headaches which are less severe. Her husband agrees that her tremors are better. Her husband reports that  she snores but not every night, mostly when she sleeps on her back. She does not typically have morning headaches.  The patient's allergies, current medications, family history, past medical history, past social history, past surgical history and problem list were reviewed and updated as appropriate.    REVIEW OF SYSTEMS: Out of a  complete 14 system review of symptoms, the patient complains only of the following symptoms, and all other reviewed systems are negative.  Headache, tremors, frequent waking  ALLERGIES: Allergies  Allergen Reactions  . Cefaclor Diarrhea and Other (See Comments)  . Codeine Other (See Comments)    Numb all over    HOME MEDICATIONS: Outpatient Medications Prior to Visit  Medication Sig Dispense Refill  . propranolol (INDERAL) 20 MG tablet Take 1 tablet (20 mg total) by mouth 2 (two) times daily. 60 tablet 5  . estradiol (VIVELLE-DOT) 0.05 MG/24HR patch   10  . progesterone (PROMETRIUM) 100 MG capsule Take 100 mg by mouth daily.  9   No facility-administered medications prior to visit.     PAST MEDICAL HISTORY: Past Medical History:  Diagnosis Date  . Dry eye     PAST SURGICAL HISTORY: Past Surgical History:  Procedure Laterality Date  . CESAREAN SECTION     x3    FAMILY HISTORY: Family History  Problem Relation Age of Onset  . Fibromyalgia Mother   . Thyroid cancer Mother   . Hypertension Father   . Fibromyalgia Sister   . Breast cancer Maternal Grandmother     SOCIAL HISTORY: Social History   Social History  . Marital status: Married    Spouse name: N/A  . Number of children: 3  . Years of education: Assoc   Occupational History  . Retired    Social History Main Topics  . Smoking status: Never Smoker  . Smokeless tobacco: Never Used  . Alcohol use No  . Drug use: No  . Sexual activity: Not on file   Other Topics Concern  . Not on file   Social History Narrative   Drinks 1 large tea a day       PHYSICAL EXAM  Vitals:   10/16/16 1312  BP: 110/70  Pulse: 82  Weight: 179 lb 12.8 oz (81.6 kg)  Height: 5' 2"  (1.575 m)   Body mass index is 32.89 kg/m.  Generalized: Well developed, in no acute distress   Neurological examination  Mentation: Alert oriented to time, place, history taking. Follows all commands speech and language  fluent Cranial nerve II-XII: Pupils were equal round reactive to light. Extraocular movements were full, visual field were full on confrontational test. Facial sensation and strength were normal. Uvula tongue midline. Head turning and shoulder shrug  were normal and symmetric. Motor: The motor testing reveals 5 over 5 strength of all 4 extremities. Good symmetric motor tone is noted throughout.  Sensory: Sensory testing is intact to soft touch on all 4 extremities. No evidence of extinction is noted.  Coordination: Cerebellar testing reveals good finger-nose-finger and heel-to-shin bilaterally.  Gait and station: Gait is normal. Tandem gait is normal. Romberg is negative. No drift is seen.  Reflexes: Deep tendon reflexes are symmetric and normal bilaterally.   DIAGNOSTIC DATA (LABS, IMAGING, TESTING) - I reviewed patient records, labs, notes, testing and imaging myself where available.     ASSESSMENT AND PLAN 56 y.o. year old female  has a past medical history of Dry eye. here with:  1. Essential tremor 2. Headache  The patient will  continue on propranolol 20 mg twice a day. She has tried higher doses of propranolol in the past but it did cause hypotension. The patient is encouraged to consider following through with a sleep study. Advised that with her risk factor she could potentially be at risk for sleep apnea. She voiced understanding. She will talk this over with her husband. Advised that if she would like [place] to schedule a sleep study to call our office. She voiced understanding. She will follow-up in 6 months with Dr. Carmelia Bake, MSN, NP-C 10/16/2016, 1:45 PM [facility] [address], [place], [place] [address] [phone]  I reviewed the above note and documentation by the Nurse Practitioner and agree with the history, physical exam, assessment and plan as outlined above. I was immediately available for face-to-face consultation. Star Age, MD, PhD [facility])

## 2017-01-08 ENCOUNTER — Other Ambulatory Visit: Payer: Self-pay | Admitting: Neurology

## 2017-01-08 DIAGNOSIS — G250 Essential tremor: Secondary | ICD-10-CM

## 2017-02-04 ENCOUNTER — Other Ambulatory Visit: Payer: Self-pay | Admitting: Neurology

## 2017-02-04 DIAGNOSIS — G250 Essential tremor: Secondary | ICD-10-CM

## 2017-04-06 ENCOUNTER — Other Ambulatory Visit: Payer: Self-pay | Admitting: Neurology

## 2017-04-06 DIAGNOSIS — G250 Essential tremor: Secondary | ICD-10-CM

## 2017-04-18 ENCOUNTER — Ambulatory Visit: Payer: BLUE CROSS/BLUE SHIELD | Admitting: Neurology

## 2017-04-18 ENCOUNTER — Telehealth: Payer: Self-pay

## 2017-04-18 NOTE — Telephone Encounter (Signed)
Pt did not show for their appt with Dr. Athar today.  

## 2017-04-19 ENCOUNTER — Encounter: Payer: Self-pay | Admitting: Neurology

## 2017-05-26 ENCOUNTER — Other Ambulatory Visit: Payer: Self-pay | Admitting: Neurology

## 2017-05-26 DIAGNOSIS — G250 Essential tremor: Secondary | ICD-10-CM

## 2017-06-13 ENCOUNTER — Ambulatory Visit: Payer: BLUE CROSS/BLUE SHIELD | Admitting: Neurology

## 2017-06-13 ENCOUNTER — Encounter: Payer: Self-pay | Admitting: Neurology

## 2017-06-13 VITALS — BP 145/90 | HR 77 | Ht 62.0 in | Wt 184.0 lb

## 2017-06-13 DIAGNOSIS — G250 Essential tremor: Secondary | ICD-10-CM | POA: Diagnosis not present

## 2017-06-13 MED ORDER — PROPRANOLOL HCL 20 MG PO TABS
20.0000 mg | ORAL_TABLET | Freq: Two times a day (BID) | ORAL | 3 refills | Status: AC
Start: 2017-06-13 — End: ?

## 2017-06-13 NOTE — Progress Notes (Signed)
Subjective:    Patient ID: Stacey Mendez is a 57 y.o. female.  HPI     Interim history:  Stacey Mendez is a 57 year old left-handed woman with an underlying medical history of hearing loss, chronic dry eyes, and obesity, and elevated blood pressure values, who presents for follow-up consultation of her tremors. The patient is unaccompanied today. Of note, she no showed for an appointment on 04/18/2017. I last saw her on 04/17/2016, at which time she reported doing better with regards to her tremor on the beta blocker. She had some interim issues with a long-acting Inderal and was advised to take the short acting Inderal twice daily.   She saw Ward Givens in the interim on 10/16/2016, at which time she was deemed stable on low-dose propranolol 20 mg twice a day for tremor control.  Today, 06/13/2017: She reports doing well on propranolol. She actually typically only takes one pill a day, typically sometime in the late morning or during the day. She has not noticed any side effects. She has not had any recent headaches but has had sinus issues, she was treated for sinus infection with a Z-Pak about 3 times. She still has nasal congestion and nasal discharge which is discolored.    The patient's allergies, current medications, family history, past medical history, past social history, past surgical history and problem list were reviewed and updated as appropriate.    Previously (copied from previous notes for reference):    I first met her on 12/13/2015 at the request of her primary care physician, at which time she reported a long-standing history of tremors. Her clinical exam was in keeping with essential tremor. I suggested symptomatic treatment with low-dose beta blocker in the form of propranolol with gradual titration. I also ordered a brain MRI with and without contrast, she had this on 01/06/2016 and I reviewed the results:     IMPRESSION:  This MRI of the brain with and without contrast  shows the following: 1.    There are a few scattered subcortical T2/FLAIR hyperintense foci in the frontal lobes consistent with minimal chronic microvascular ischemic changes or changes related to migraine. None of these appear to be acute. 2.     There is a "partially empty sella turcica" in the optic nerve sheaths are mildly widened. Although this could be an incidental finding, this could also be due to elevated intracranial pressure. 3.     There are no acute findings.   We called her with her test results at the time. She had met with her ophthalmologist in the interim and had a benign exam without any visual field deficits. I suggested in the interim that we change her propranolol to once daily long-acting after she was able to titrate the immediate release by the end of November 2017.   I had also talked to her ophthalmologist, Dr. Jerline Pain in the interim on the phone about the MRI results.    12/13/2015: She reports bilateral upper extremity tremors for years. She has a family history of tremors in her brother and one sister. She was recently treated for sinusitis. She was recently also evaluated by her ophthalmologist for problems with her eyes and was diagnosed with dry eyes. I reviewed your office note from 11/19/2015, which you kindly included. Recent lab work includes CBC with differential, TSH, CMP, ESR, lipid panel. We will request test results from your office. She was treated with a Z-Pak in late September 2017 for sinusitis.  She also  has a history of recurrent migrainous headaches for the past 2 months. She has been seen by ENT for suspected sinus issues. She had a CT which reportedly was normal. 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 I reviewed the office note from 11/30/2015, from Dr. Caprice Kluver as well. She has a Hx of migraines, infrequent, 1-2 per year.  HAs or eye pain is around the eyes, L more than R. Has had more anxiety. No recent stressors. She lives with her husband,  this is her second marriage. She has 3 grown children from her first marriage. She does not drink caffeine in excess, usually one large sweet tea from [employer] per day. She is a nonsmoker and does not drink alcohol on a regular basis. She has 1 brother and 1 sister with tremors, one aunt with Parkinson's disease. She is left-handed and her handwriting is not particularly affected by her fine motor skills are affected by the tremor. Nervousness and anxiety makes the tremor worse. She has had hot flashes and is perimenopausal. She has no overt triggers for her headaches or eye pain. She does endorse being anxious. Her husband vehemently endorses that she has been anxious. She has had recent borderline blood pressure values and was asked to keep a log. This also caused her anxiety to get worse she admits.  Her Past Medical History Is Significant For: Past Medical History:  Diagnosis Date  . Dry eye     Her Past Surgical History Is Significant For: Past Surgical History:  Procedure Laterality Date  . CESAREAN SECTION     x3    Her Family History Is Significant For: Family History  Problem Relation Age of Onset  . Fibromyalgia Mother   . Thyroid cancer Mother   . Hypertension Father   . Fibromyalgia Sister   . Breast cancer Maternal Grandmother     Her Social History Is Significant For: Social History   Socioeconomic History  . Marital status: Married    Spouse name: Not on file  . Number of children: 3  . Years of education: Assoc  . Highest education level: Not on file  Occupational History  . Occupation: Retired  Scientific laboratory technician  . Financial resource strain: Not on file  . Food insecurity:    Worry: Not on file    Inability: Not on file  . Transportation needs:    Medical: Not on file    Non-medical: Not on file  Tobacco Use  . Smoking status: Never Smoker  . Smokeless tobacco: Never Used  Substance and Sexual Activity  . Alcohol use: No  . Drug use: No  . Sexual  activity: Not on file  Lifestyle  . Physical activity:    Days per week: Not on file    Minutes per session: Not on file  . Stress: Not on file  Relationships  . Social connections:    Talks on phone: Not on file    Gets together: Not on file    Attends religious service: Not on file    Active member of club or organization: Not on file    Attends meetings of clubs or organizations: Not on file    Relationship status: Not on file  Other Topics Concern  . Not on file  Social History Narrative   Drinks 1 large tea a day     Her Allergies Are:  Allergies  Allergen Reactions  . Cefaclor Diarrhea and Other (See Comments)  . Codeine Other (See Comments)  Numb all over  :   Her Current Medications Are:  Outpatient Encounter Medications as of 06/13/2017  Medication Sig  . Nutritional Supplements (ESTROVEN PO) Take by mouth. [place]roven for sleep  . propranolol (INDERAL) 20 MG tablet TAKE 1 TABLET BY MOUTH TWICE DAILY   No facility-administered encounter medications on file as of 06/13/2017.   :  Review of Systems:  Out of a complete 14 point review of systems, all are reviewed and negative with the exception of these symptoms as listed below: Review of Systems  Neurological:       Pt presents today to discuss her tremor. Pt has cut back on propranolol to just taking once daily. Pt's tremor is stable, per pt report. Pt denies any headaches.    Objective:  Neurological Exam  Physical Exam Physical Examination:   Vitals:   06/13/17 0926  BP: (!) 145/90  Pulse: 77   General Examination: The patient is a very pleasant 57 y.o. female in no acute distress. She appears well-developed and well-nourished and well groomed.   HEENT: Normocephalic, atraumatic, pupils are equal, round and reactive to light and accommodation. Extraocular tracking is good without limitation to gaze excursion or nystagmus noted. Normal smooth pursuit is noted. Hearing is mildly impaired and she forgot  her hearing aids. Face is symmetric with normal facial animation and normal facial sensation. Speech is clear with no dysarthria noted. There is no hypophonia.  She has a very slight intermittent voice tremor. She does sound congested. Oropharynx exam reveals: mild mouth dryness, tongue protrudes centrally and palate elevates symmetrically. Tonsils are small.   Chest: Clear to auscultation without wheezing, rhonchi or crackles noted.  Heart: S1+S2+0, regular and normal without murmurs, rubs or gallops noted.   Abdomen: Soft, non-tender and non-distended with normal bowel sounds appreciated on auscultation.  Extremities: There is no pitting edema in the distal lower extremities bilaterally.   Skin: Warm and dry without trophic changes noted.  Musculoskeletal: exam reveals no obvious joint deformities, tenderness or joint swelling or erythema.   Neurologically:  Mental status: The patient is awake, alert and oriented in all 4 spheres. Her immediate and remote memory, attention, language skills and fund of knowledge are appropriate. There is no evidence of aphasia, agnosia, apraxia or anomia. Speech is clear with normal prosody and enunciation. Thought process is linear. Mood is normal and affect is normal.  Cranial nerves II - XII are as described above under HEENT exam.  Motor exam: Normal bulk, strength and tone is noted. There is no drift, resting tremor or rebound. Romberg is negative. She has no significant postural tremor, slight action tremor bilaterally, stable. Fine motor skills with finger taps and hand movements as well as foot agility is normal. Cerebellar testing shows no dysmetria or intention tremor.  Sensory exam: intact to light touch in the upper and lower extremities.  Gait, station and balance: She stands easily. No veering to one side is noted. No leaning to one side is noted. Posture is age-appropriate and stance is narrow based. Gait shows normal stride length and  normal pace. No problems turning are noted. Tandem walk is good today.   Assessment and Plan:   In summary, Stacey Mendez is a very pleasant 57 year old female with an underlying medical history of hearing loss, dry eyes, elevated blood pressure values and obesity who presents for follow-up consultation of her essential tremor. She has had recurrent headaches in the past but not recently. She has had some sinus  issues and has been treated for sinus infection repeatedly since late last year. She continues to have sinus drainage. She is encouraged to talk to her primary care physician about potentially seeing ENT at this point. She has had improvement in her tremor with symptomatic treatment with low-dose propranolol. She currently takes only 20 mg once daily on average. She can continue with this regimen. I renewed her prescription for 90 days with refills. At this juncture she can follow-up in one year, she can see one of our nurse practitioners next time as she is doing well. Of note, she had an MRI brain in the recent past which showed benign findings. I answered all her questions today and she was in agreement with the plan.I spent 20 minutes in total face-to-face time with the patient, more than 50% of which was spent in counseling and coordination of care, reviewing test results, reviewing medication and discussing or reviewing the diagnosis of ET, its prognosis and treatment options. Pertinent laboratory and imaging test results that were available during this visit with the patient were reviewed by me and considered in my medical decision making (see chart for details).

## 2017-06-13 NOTE — Patient Instructions (Addendum)
Your exam is fairly stable, you can continue with propranolol low-dose once daily. We can see you in 1 year, you can see one of our nurse practitioners as you are stable. I will see you after that.   Please remember, that any kind of tremor may be exacerbated by anxiety, anger, nervousness, excitement, dehydration, sleep deprivation, by caffeine, and low blood sugar values or blood sugar fluctuations. Some medications, especially some antidepressants and lithium can cause or exacerbate tremors.

## 2017-07-04 IMAGING — CR DG RIBS W/ CHEST 3+V*L*
3 series · 3 of 3 positions shown · non-contrast
Comparison: Chest x-ray of 02/27/2012

CLINICAL DATA: Left chest wall and rib pain for 4 days, no acute
injury

EXAM:
LEFT RIBS AND CHEST - 3+ VIEW

[w chest pa]
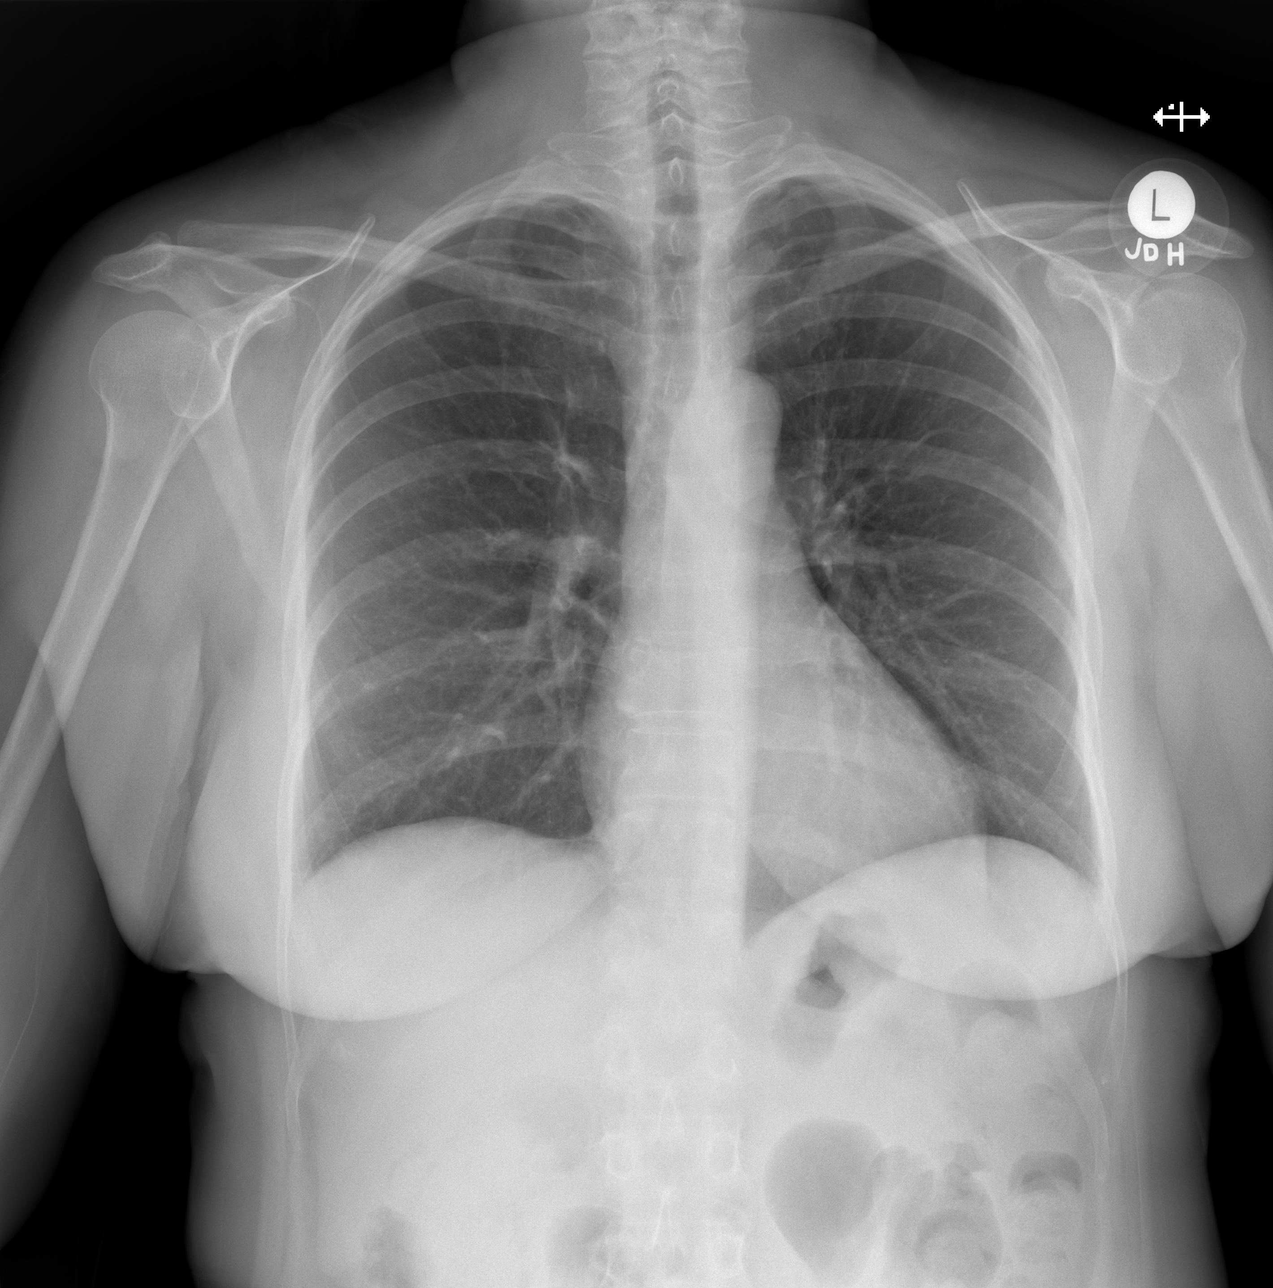

[w ribs ap upper left]
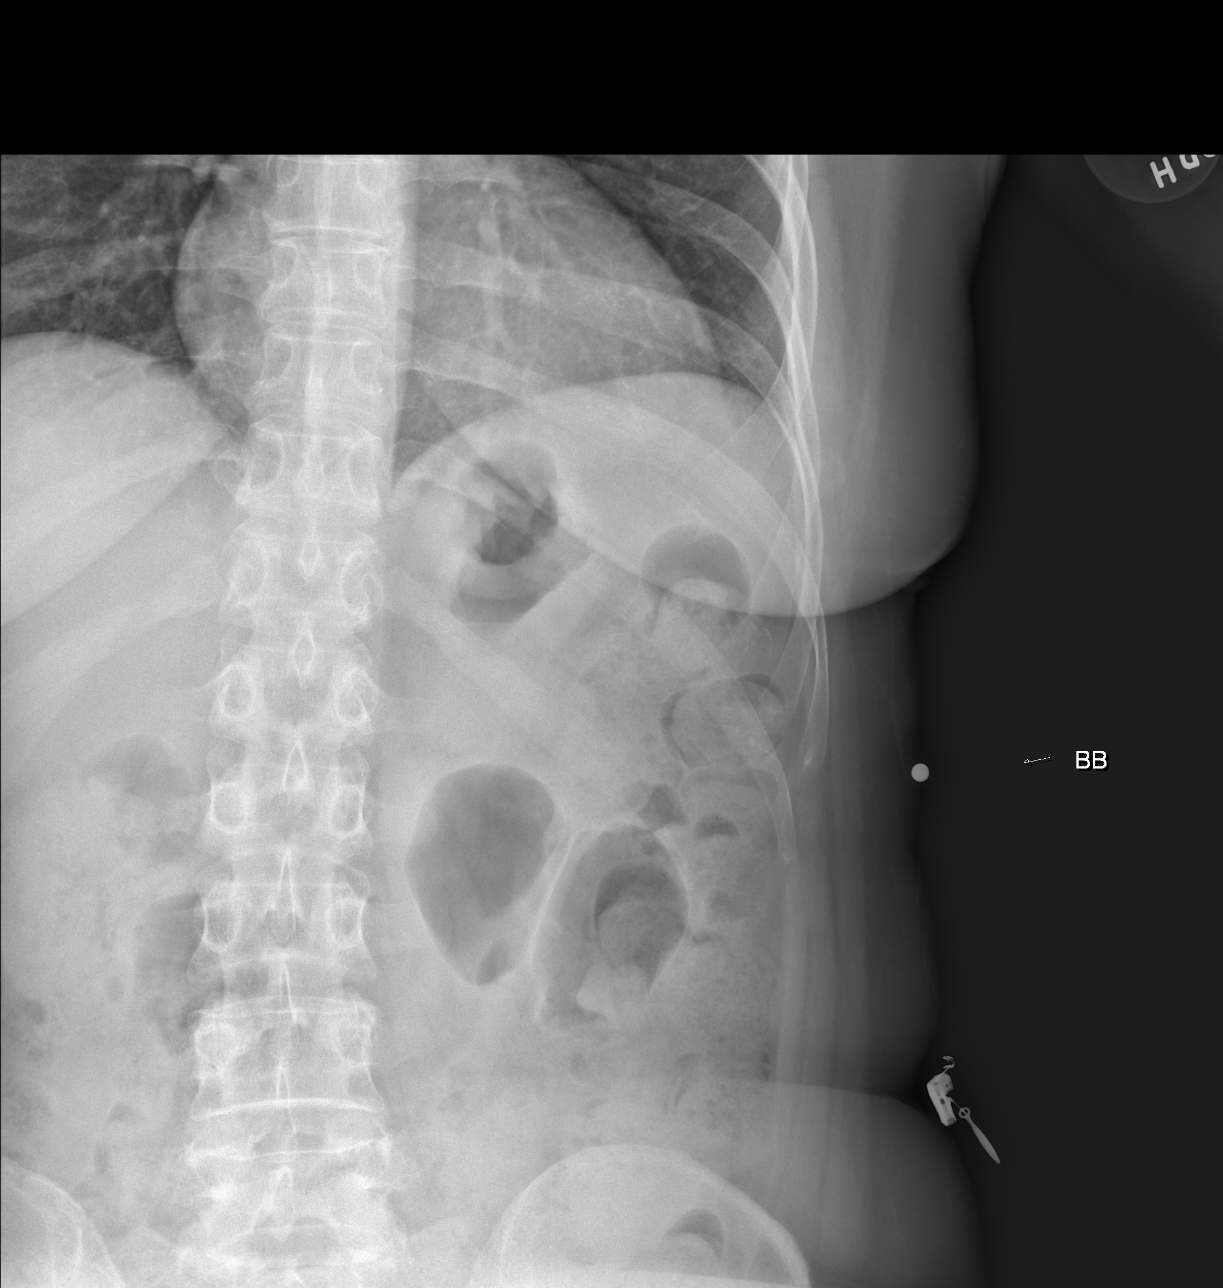

[w ribs obl left]
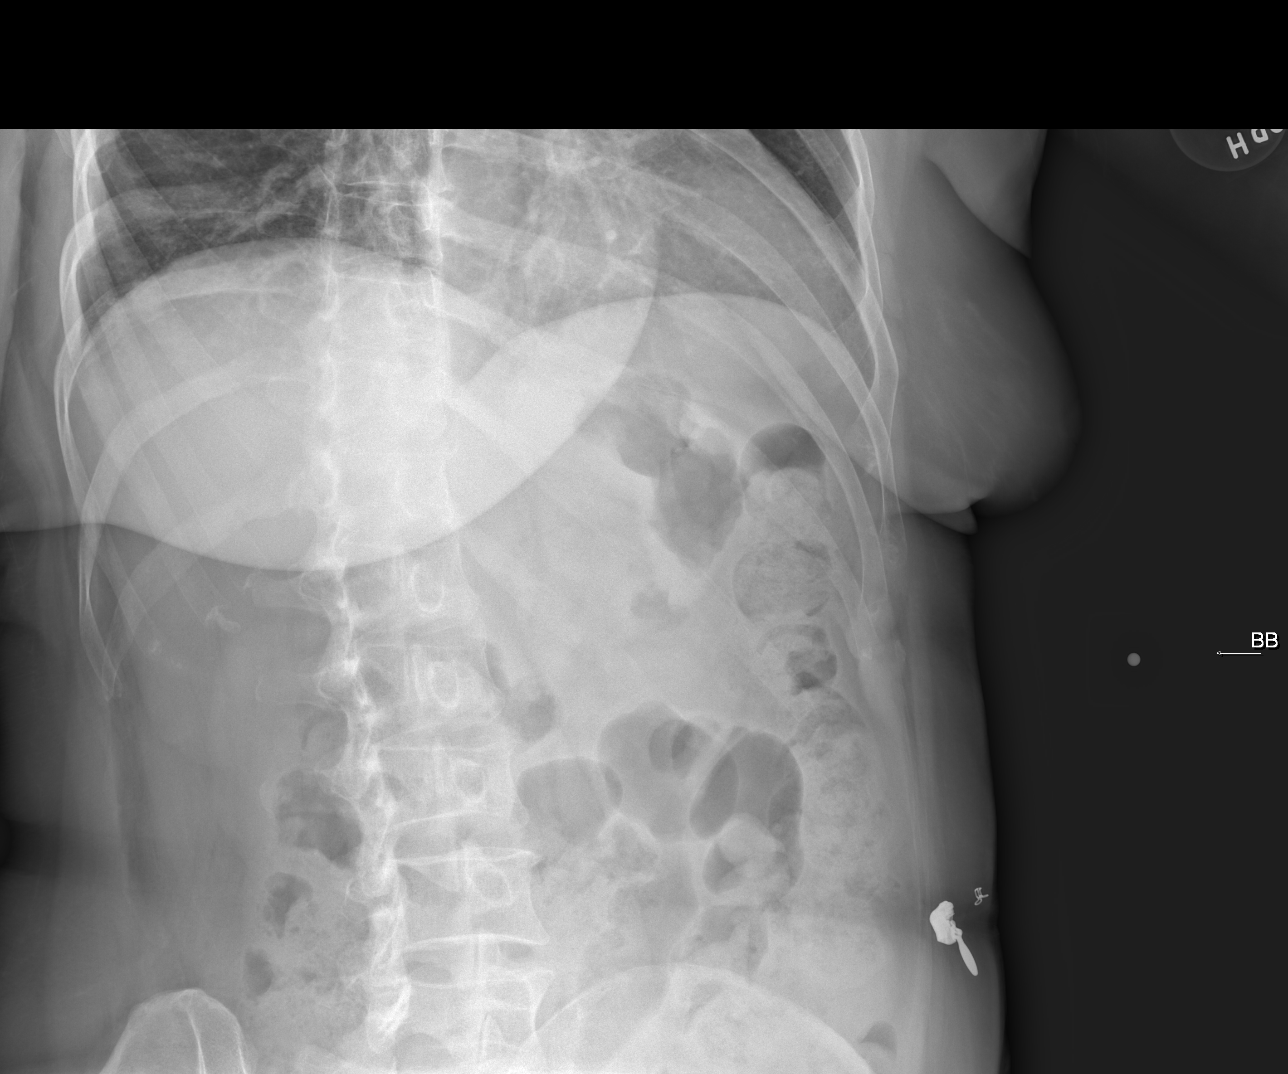

[3 of 3 positions shown; findings below may reference images not displayed]

FINDINGS: The lungs are clear and well aerated. Mediastinal and hilar contours
are unremarkable. The heart is within normal limits in size. Left
rib detail films with the area of pain marked with a BB, show no rib
fracture. No abnormality is seen.
IMPRESSION: 1. No active lung disease.
2. Negative left rib detail.

## 2018-06-19 ENCOUNTER — Ambulatory Visit: Payer: BLUE CROSS/BLUE SHIELD | Admitting: Adult Health
